# Patient Record
Sex: Male | Born: 1950 | Race: White | Hispanic: Yes
Health system: Southern US, Community
[De-identification: ages and names within clinical notes are randomized; demographics above are authoritative.]

---

## 1998-10-09 ENCOUNTER — Ambulatory Visit (HOSPITAL_COMMUNITY): Admission: RE | Admit: 1998-10-09 | Discharge: 1998-10-09 | Payer: Self-pay | Admitting: Neurology

## 1998-10-09 ENCOUNTER — Encounter: Payer: Self-pay | Admitting: Neurology

## 2005-05-31 ENCOUNTER — Emergency Department (HOSPITAL_COMMUNITY): Admission: EM | Admit: 2005-05-31 | Discharge: 2005-05-31 | Payer: Self-pay | Admitting: Emergency Medicine

## 2005-06-10 ENCOUNTER — Ambulatory Visit (HOSPITAL_BASED_OUTPATIENT_CLINIC_OR_DEPARTMENT_OTHER): Admission: RE | Admit: 2005-06-10 | Discharge: 2005-06-10 | Payer: Self-pay | Admitting: Otolaryngology
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
## 2014-01-15 ENCOUNTER — Emergency Department (HOSPITAL_COMMUNITY)
Admission: EM | Admit: 2014-01-15 | Discharge: 2014-02-04 | Disposition: E | Payer: Self-pay | Attending: Emergency Medicine | Admitting: Emergency Medicine

## 2014-01-15 DIAGNOSIS — Z9889 Other specified postprocedural states: Secondary | ICD-10-CM

## 2014-01-15 DIAGNOSIS — I469 Cardiac arrest, cause unspecified: Secondary | ICD-10-CM | POA: Insufficient documentation

## 2014-01-15 DIAGNOSIS — R4182 Altered mental status, unspecified: Secondary | ICD-10-CM | POA: Insufficient documentation

## 2014-01-15 DIAGNOSIS — R0602 Shortness of breath: Secondary | ICD-10-CM | POA: Insufficient documentation

## 2014-01-15 DIAGNOSIS — R05 Cough: Secondary | ICD-10-CM | POA: Insufficient documentation

## 2014-01-15 DIAGNOSIS — R0981 Nasal congestion: Secondary | ICD-10-CM | POA: Insufficient documentation

## 2014-01-15 MED ORDER — SODIUM BICARBONATE 8.4 % IV SOLN
INTRAVENOUS | Status: AC | PRN
  Administered 2014-01-15: 50 meq via INTRAVENOUS

## 2014-01-15 MED ORDER — EPINEPHRINE HCL 0.1 MG/ML IJ SOSY
PREFILLED_SYRINGE | INTRAMUSCULAR | Status: AC | PRN
  Administered 2014-01-15 (×3): 1 mg via INTRAVENOUS

## 2014-01-15 MED ORDER — CALCIUM CHLORIDE 10 % IV SOLN
INTRAVENOUS | Status: AC | PRN
  Administered 2014-01-15: 1 g via INTRAVENOUS

## 2014-01-16 NOTE — Progress Notes (Signed)
Chaplain was present when pt arrived with active CPR in progress.  Chaplain attempted to call emergency contact person.  All phone numbers in chart are disconnected.  RN brought chaplain pt's phone since it kept ringing with same number calling.  Chaplain answered phone but was unable to communicate due to foreign language unkown to chaplain.  RN and chaplain contacted interpreter services who were able to 3 way call via RN's phone to communicate.  We were able to communicate with pt's spouse and convince her to come to ED.  When family arrived, a family member translated for MD and RN during consult to inform family that pt has died.  Chaplain provided emotional support to family and offered to contact local Im[facility] but family declined.    12-17-14 2200  Clinical Encounter Type  Visited With Patient;Family;Health care provider  Visit Type Initial;Social support;Death;ED  Referral From Care management  Spiritual Encounters  Spiritual Needs Emotional;Grief support  Stress Factors  Family Stress Factors Family relationships;Lack of knowledge;Major life changes   Blain PaisOvercash, Yahira Timberman A, Chaplain 01/16/2014 7:52 AM

## 2014-02-04 DIAGNOSIS — 419620001 Death: Secondary | SNOMED CT

## 2014-02-04 NOTE — ED Provider Notes (Signed)
CSN: [id number]     Arrival date & time 08/26/2014  2154 History   First MD Initiated Contact with Patient 008/22/2016 2212     Chief Complaint  Patient presents with  . CPR      (Consider location/radiation/quality/duration/timing/severity/associated sxs/prior Treatment) Patient is a 64 y.o. male presenting with general illness.  Illness Location:  Cardiac arrest Quality:  Initial vfib, follwed by PEA then asystole Severity:  Severe Onset quality:  Sudden Duration: minutes. Timing:  Constant Progression:  Worsening Chronicity:  New Context:  A couple days of cough, SOB, "cold" per wife, syncopized while working at [employer] Relieved by:  Nothing Worsened by:  Nothing Ineffective treatments:  Defibrillation, epinephrine Associated symptoms: cough (per wife after recusitation) and shortness of breath (per wife after recusitation)   Risk factors:  No med probs   No past medical history on file. No past surgical history on file. No family history on file. History  Substance Use Topics  . Smoking status: Not on file  . Smokeless tobacco: Not on file  . Alcohol Use: Not on file    Review of Systems  Unable to perform ROS: Mental status change  Respiratory: Positive for cough (per wife after recusitation) and shortness of breath (per wife after recusitation).       Allergies  Review of patient's allergies indicates not on file.  Home Medications   Prior to Admission medications   Not on File   BP 0/0 mmHg  Pulse 0  Temp(Src) 96.6 F (35.9 C) (Tympanic)  Resp 0  Ht 6' (1.829 m)  Wt 190 lb (86.183 kg)  BMI 25.76 kg/m2  SpO2 0% Physical Exam  Constitutional: He appears toxic. He has a sickly appearance.  King airway LUCAS chest compressions  HENT:  Head: Normocephalic and atraumatic.  Mouth/Throat: No oropharyngeal exudate.  Eyes: Right pupil is not reactive. Right pupil is round. Left pupil is not reactive. Left pupil is round. Pupils are equal.  Neck: No  tracheal deviation present.  Cardiovascular:  Pulses:      Carotid pulses are 0 on the right side.      Femoral pulses are 0 on the right side. Pulse only with LUCAS in place  Pulmonary/Chest: No respiratory distress.  Abdominal: He exhibits distension (mild in setting of king airway).  Musculoskeletal: He exhibits no edema.  Neurological: He is unresponsive.  Skin: No rash noted.    ED Course  INTUBATION Date/Time: 01/16/2014 10:31 AM Performed by: Alvira MondaySCHLOSSMAN, Maile Linford Authorized by: Alvira MondaySCHLOSSMAN, Maureen Delatte Consent: The procedure was performed in an emergent situation. Required items: required blood products, implants, devices, and special equipment available Time out: Immediately prior to procedure a "time out" was called to verify the correct patient, procedure, equipment, support staff and site/side marked as required. Indications: airway protection and  respiratory failure Intubation method: video-assisted (glidescope) Patient status: unconscious Preoxygenation: ILMA/LMA (king airway) Laryngoscope size: Mac 4 Tube size: 7.5 mm Tube type: cuffed Number of attempts: 1 Cords visualized: yes Post-procedure assessment: chest rise and ETCO2 monitor Breath sounds: equal Cuff inflated: yes Tube secured with: ETT holder Patient tolerance: Patient tolerated the procedure well with no immediate complications   (including critical care time) Labs Review Labs Reviewed - No data to display  Imaging Review No results found.   EKG Interpretation None      MDM   Final diagnoses:  Cardiac arrest  Required emergent intubation  Asystole  Death   64 year old male with no significant medical history (wife reports he  did not go to doctor) presents with concern of cardiac arrest. Wife reports that he's had several days of cough congestion and shortness of breath.  Today while working at [employer] he became unresponsive and on EMS arrival he was found to be in V. fib arrest. He was  defibrillated once and went into PEA arrest and remained in PEA through multiple rounds of CPR and 4 doses of epinephrine.  On arrival to the emergency department he had a King airway in place and was in PEA.  Chest compressions were continued, he was given 3 doses of epinephrine, 1g calcium, and 1amp sodium bicarbonate.  He remained in PEA.  A bedside ultrasound was performed which did not show any signs of tamponade,  and showed no sign of cardiac activity. He was intubated using the glide scope without complication.  Given patient asystole on final rhythm check, without reversible cause, fixed and dilated pupils after 50 minutes without a pulse stopped resuscitation efforts.  Time of death was called at 22:09.  Medical examiner reports he will be in a ME case.  Family came to the hospital and was notified.       Alvira Monday, MD 01/16/14 1040  Alvira Monday, MD 01/16/14 1125  Audree Camel, MD 01/18/14 1724

## 2014-02-04 NOTE — Code Documentation (Addendum)
CPR paused for pulse check 

## 2014-02-04 NOTE — ED Notes (Signed)
CPR paused for pulse check by Dr. Criss AlvineGoldston- no pulses present.  Pt remains asystole on the monitor.

## 2014-02-04 NOTE — Code Documentation (Signed)
No pulse present- CPR restarted

## 2014-02-04 NOTE — Code Documentation (Signed)
zoll pads applied by staff

## 2014-02-04 NOTE — ED Notes (Signed)
Tim  [facility]Neal[facility]

## 2014-02-04 NOTE — Code Documentation (Addendum)
CPR paused for pulse check- no cardiac activity present on cardiac ultrasound.

## 2014-02-04 NOTE — ED Notes (Signed)
Pt to ED via [facility]- CPR in progress.  Pt was at work when he collapsed- staff called EMS who found pt to be pulsess in v-fib, CPR started- pt shocked with 200 joules- pt then went into PEA- CPR continued, total of 5 Epi given en route- pt in asystole upon arrival- no pulses present.  CPR continued.

## 2014-02-04 DEATH — deceased
